# Patient Record
Sex: Male | Born: 1980 | Race: White | Hispanic: No | Marital: Married
Health system: Southern US, Community
[De-identification: ages and names within clinical notes are randomized; demographics above are authoritative.]

---

## 2015-10-12 ENCOUNTER — Emergency Department (HOSPITAL_BASED_OUTPATIENT_CLINIC_OR_DEPARTMENT_OTHER): Payer: Worker's Compensation

## 2015-10-12 ENCOUNTER — Emergency Department (HOSPITAL_BASED_OUTPATIENT_CLINIC_OR_DEPARTMENT_OTHER)
Admission: EM | Admit: 2015-10-12 | Discharge: 2015-10-13 | Disposition: A | Discharge status: Home / Self Care | Payer: Worker's Compensation | Attending: Emergency Medicine | Admitting: Emergency Medicine

## 2015-10-12 DIAGNOSIS — S161XXA Strain of muscle, fascia and tendon at neck level, initial encounter: Secondary | ICD-10-CM | POA: Insufficient documentation

## 2015-10-12 DIAGNOSIS — S060X0A Concussion without loss of consciousness, initial encounter: Secondary | ICD-10-CM | POA: Diagnosis not present

## 2015-10-12 DIAGNOSIS — Y9289 Other specified places as the place of occurrence of the external cause: Secondary | ICD-10-CM | POA: Diagnosis not present

## 2015-10-12 DIAGNOSIS — S0990XA Unspecified injury of head, initial encounter: Secondary | ICD-10-CM | POA: Diagnosis present

## 2015-10-12 DIAGNOSIS — Y9389 Activity, other specified: Secondary | ICD-10-CM | POA: Diagnosis not present

## 2015-10-12 DIAGNOSIS — Y990 Civilian activity done for income or pay: Secondary | ICD-10-CM | POA: Insufficient documentation

## 2015-10-12 NOTE — ED Provider Notes (Signed)
CSN: [id number]     Arrival date & time 10/12/15  2024 History  By signing my name below, I, Levon HedgerElizabeth Hall, attest that this documentation has been prepared under the direction and in the presence of Pricilla LovelessScott Awab Abebe, MD . Electronically Signed: Levon HedgerElizabeth Hall, Scribe. 10/12/2015. 10:44 PM.     Chief Complaint  Patient presents with  . Head Injury  . Neck Pain    The history is provided by the patient. No language interpreter was used.    HPI Comments:  Trevor Everett is a 35 y.o. male who presents to the Emergency Department s/p head injury that occurred earlier tonight. Pt states he was hit on very top of head with plane landing gear weighing ~100 lbs while working at [employer]. Pt was wearing a semi protective hat. Pt notes associated fatigue and worsening 7/10 neck and back pain s/p head injury. Pt has PMHx of ruptured disk and has baseline neck pain, but states this is worse. He has not taken any OTC medicine for pain. Pt denies any LOC, nausea, vomiting, blurry vision, dizziness weakness, tingling, or numbness.   No past medical history on file. No past surgical history on file. No family history on file. Social History  Substance Use Topics  . Smoking status: Not on file  . Smokeless tobacco: Not on file  . Alcohol Use: Not on file    Review of Systems  Constitutional: Positive for fatigue.  Eyes: Negative for visual disturbance.  Gastrointestinal: Negative for nausea and vomiting.  Musculoskeletal: Positive for back pain, arthralgias and neck pain.  Neurological: Negative for dizziness and syncope.  All other systems reviewed and are negative.   Allergies  Review of patient's allergies indicates not on file.  Home Medications   Prior to Admission medications   Medication Sig Start Date End Date Taking? Authorizing Provider  Cyclobenzaprine HCl (FLEXERIL PO) Take by mouth.   Yes Historical Provider, MD  MORPHINE SULFATE PO Take by mouth.   Yes Historical Provider, MD   Oxycodone-Acetaminophen (PERCOCET PO) Take by mouth.   Yes Historical Provider, MD   BP 141/87 mmHg  Pulse 66  Temp(Src) 98.2 F (36.8 C) (Oral)  Resp 20  Ht 5\' 6"  (1.676 m)  Wt 183 lb (83.008 kg)  BMI 29.55 kg/m2  SpO2 99% Physical Exam  Constitutional: He is oriented to person, place, and time. He appears well-developed and well-nourished.  HENT:  Head: Normocephalic and atraumatic.  Right Ear: External ear normal.  Left Ear: External ear normal.  Nose: Nose normal.  No scalp tenderness or obvious deformity or injury  Eyes: EOM are normal. Pupils are equal, round, and reactive to light. Right eye exhibits no discharge. Left eye exhibits no discharge.  Neck: Neck supple. Spinous process tenderness and muscular tenderness present.  Cardiovascular: Normal rate, regular rhythm, normal heart sounds and intact distal pulses.   Pulmonary/Chest: Effort normal and breath sounds normal.  Abdominal: Soft. There is no tenderness.  Musculoskeletal: He exhibits no edema.       Cervical back: He exhibits tenderness and bony tenderness.       Thoracic back: He exhibits tenderness and bony tenderness.       Lumbar back: He exhibits tenderness and bony tenderness.  Neurological: He is alert and oriented to person, place, and time.  Cn3-12 grossly intact 5/5 strength in all four extremities Grossly normal sensations Normal finger to nose  Skin: Skin is warm and dry.  Nursing note and vitals reviewed.   ED  Course  Procedures  DIAGNOSTIC STUDIES: Oxygen Saturation is 99% on RA, normal by my interpretation.    COORDINATION OF CARE:  10:39 PM Discussed treatment plan which includes head CT with pt at bedside and pt agreed to plan.  Labs Review Labs Reviewed - No data to display  Imaging Review Dg Thoracic Spine 4v  10/13/2015  CLINICAL DATA:  Crush injury to the top of the head tonight. Diffuse thoracic spine pain. EXAM: THORACIC SPINE - 4+ VIEW COMPARISON:  None. FINDINGS: There is  no evidence of thoracic spine fracture. Alignment is normal. No other significant bone abnormalities are identified. IMPRESSION: Negative. Electronically Signed   By: Burman Nieves M.D.   On: 10/13/2015 00:05   Dg Lumbar Spine Complete  10/13/2015  CLINICAL DATA:  Crush injury to the top of the head. Headache, neck pain, and back pain. EXAM: LUMBAR SPINE - COMPLETE 4+ VIEW COMPARISON:  None. FINDINGS: There is no evidence of lumbar spine fracture. Alignment is normal. Intervertebral disc spaces are maintained. IMPRESSION: Negative. Electronically Signed   By: Burman Nieves M.D.   On: 10/13/2015 00:04   Ct Head Wo Contrast  10/12/2015  CLINICAL DATA:  Landing gear of jet fell on patient's head, with headache and lower posterior neck pain. Initial encounter. EXAM: CT HEAD WITHOUT CONTRAST CT CERVICAL SPINE WITHOUT CONTRAST TECHNIQUE: Multidetector CT imaging of the head and cervical spine was performed following the standard protocol without intravenous contrast. Multiplanar CT image reconstructions of the cervical spine were also generated. COMPARISON:  None. FINDINGS: CT HEAD FINDINGS There is no evidence of acute infarction, mass lesion, or intra- or extra-axial hemorrhage on CT. The posterior fossa, including the cerebellum, brainstem and fourth ventricle, is within normal limits. The third and lateral ventricles, and basal ganglia are unremarkable in appearance. The cerebral hemispheres are symmetric in appearance, with normal gray-white differentiation. No mass effect or midline shift is seen. There is no evidence of fracture; visualized osseous structures are unremarkable in appearance. The visualized portions of the orbits are within normal limits. The paranasal sinuses and mastoid air cells are well-aerated. No significant soft tissue abnormalities are seen. CT CERVICAL SPINE FINDINGS There is no evidence of fracture or subluxation. Loss of the normal lordotic curvature of the cervical spine is  likely positional in nature. Vertebral bodies demonstrate normal height and alignment. Intervertebral disc spaces are preserved. Prevertebral soft tissues are within normal limits. The visualized neural foramina are grossly unremarkable. The thyroid gland is unremarkable in appearance. The visualized lung apices are clear. No significant soft tissue abnormalities are seen. IMPRESSION: 1. No evidence of traumatic intracranial injury or fracture. 2. No evidence of fracture or subluxation along the cervical spine. Electronically Signed   By: Roanna Raider M.D.   On: 10/12/2015 23:41   Ct Cervical Spine Wo Contrast  10/12/2015  CLINICAL DATA:  Landing gear of jet fell on patient's head, with headache and lower posterior neck pain. Initial encounter. EXAM: CT HEAD WITHOUT CONTRAST CT CERVICAL SPINE WITHOUT CONTRAST TECHNIQUE: Multidetector CT imaging of the head and cervical spine was performed following the standard protocol without intravenous contrast. Multiplanar CT image reconstructions of the cervical spine were also generated. COMPARISON:  None. FINDINGS: CT HEAD FINDINGS There is no evidence of acute infarction, mass lesion, or intra- or extra-axial hemorrhage on CT. The posterior fossa, including the cerebellum, brainstem and fourth ventricle, is within normal limits. The third and lateral ventricles, and basal ganglia are unremarkable in appearance. The cerebral hemispheres are symmetric  in appearance, with normal gray-white differentiation. No mass effect or midline shift is seen. There is no evidence of fracture; visualized osseous structures are unremarkable in appearance. The visualized portions of the orbits are within normal limits. The paranasal sinuses and mastoid air cells are well-aerated. No significant soft tissue abnormalities are seen. CT CERVICAL SPINE FINDINGS There is no evidence of fracture or subluxation. Loss of the normal lordotic curvature of the cervical spine is likely positional in  nature. Vertebral bodies demonstrate normal height and alignment. Intervertebral disc spaces are preserved. Prevertebral soft tissues are within normal limits. The visualized neural foramina are grossly unremarkable. The thyroid gland is unremarkable in appearance. The visualized lung apices are clear. No significant soft tissue abnormalities are seen. IMPRESSION: 1. No evidence of traumatic intracranial injury or fracture. 2. No evidence of fracture or subluxation along the cervical spine. Electronically Signed   By: Roanna Raider M.D.   On: 10/12/2015 23:41   I have personally reviewed and evaluated these images and lab results as part of my medical decision-making.   EKG Interpretation None      MDM   Final diagnoses:  Concussion, without loss of consciousness, initial encounter  Neck strain, initial encounter    No acute findings on CT/xrays as above. Full ROM and c-collar removed. Patient's pain appears mild. Neurologically intact. Doubt ligamentous injury. Treat with nsaids and discussed return precautions. HA likely a mild concussion.  I personally performed the services described in this documentation, which was scribed in my presence. The recorded information has been reviewed and is accurate.    Pricilla Loveless, MD 10/13/15 [phone]

## 2015-10-12 NOTE — ED Notes (Signed)
[facility] ([place] [phone]) called  PTA and reported bystander/ witness concern for concussion r/t sx of: sleepiness, HA, light-headedness.

## 2015-10-12 NOTE — ED Notes (Addendum)
He was hit in the top of his head with landing gear while working at [employer]. No LOC. Neck pain. c collar applied at triage. Neuro intact.

## 2015-10-12 NOTE — ED Notes (Signed)
Patient in CT

## 2016-12-07 IMAGING — CT CT CERVICAL SPINE W/O CM
4 of 7 series · 14 of 33 positions shown, 15 images · non-contrast
Comparison: None.

CLINICAL DATA: [employer] Dad[employer] of jet fell on patient's head, with
headache and lower posterior neck pain. Initial encounter.

EXAM:
CT HEAD WITHOUT CONTRAST
CT CERVICAL SPINE WITHOUT CONTRAST
TECHNIQUE: Multidetector CT imaging of the head and cervical spine was
performed following the standard protocol without intravenous
contrast. Multiplanar CT image reconstructions of the cervical spine
were also generated.

[Series 6: c_spine 2.0 i30s 3 · axial · 0.25mm/px · z∈[-305,-203]mm · 4 of 85 slices shown]
[im 17/85  bone]
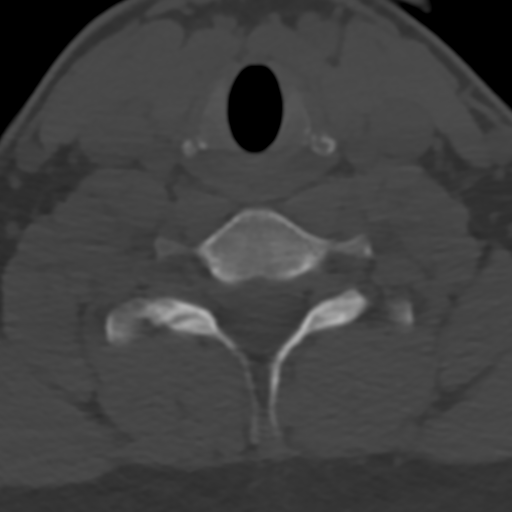
[im 34/85  bone]
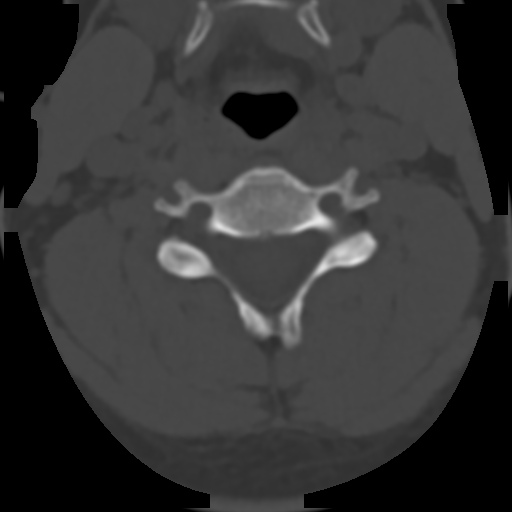
[im 51/85  bone]
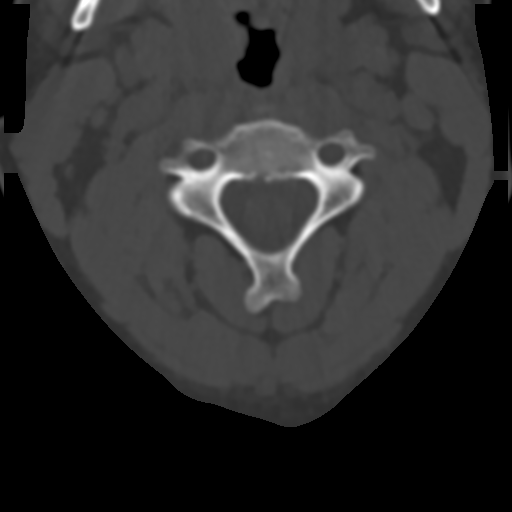
[im 68/85  bone]
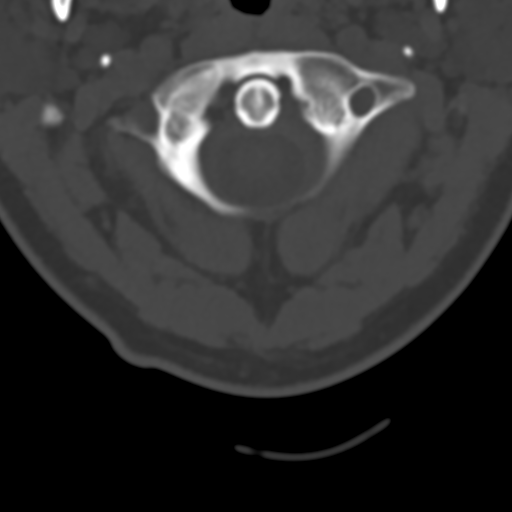

[Series 8: head 3.0 mpr cor · coronal · 0.30mm/px · 2 of 64 slices shown]
[im 22/64  bone]
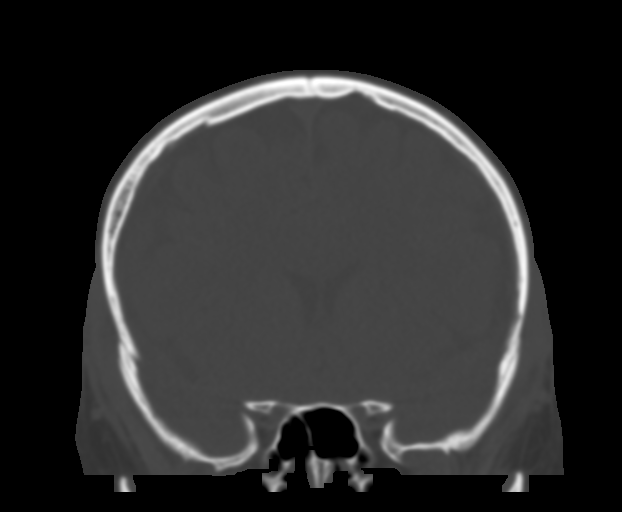
[im 43/64  bone]
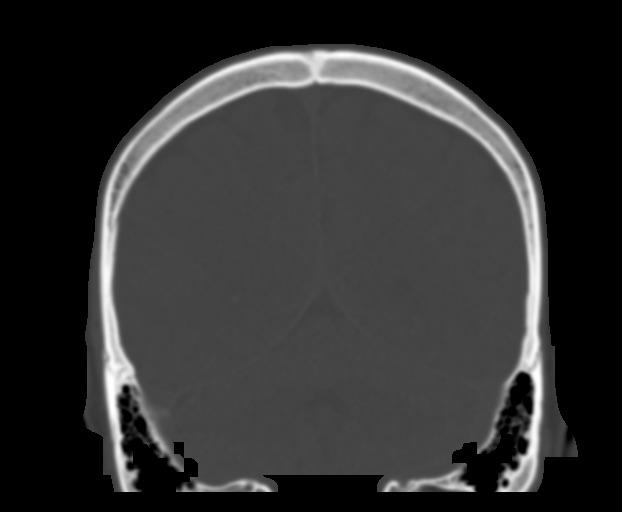

[Series 11: sagittals · sagittal · 0.23mm/px · 4 of 47 slices shown]
[im 10/47  bone]
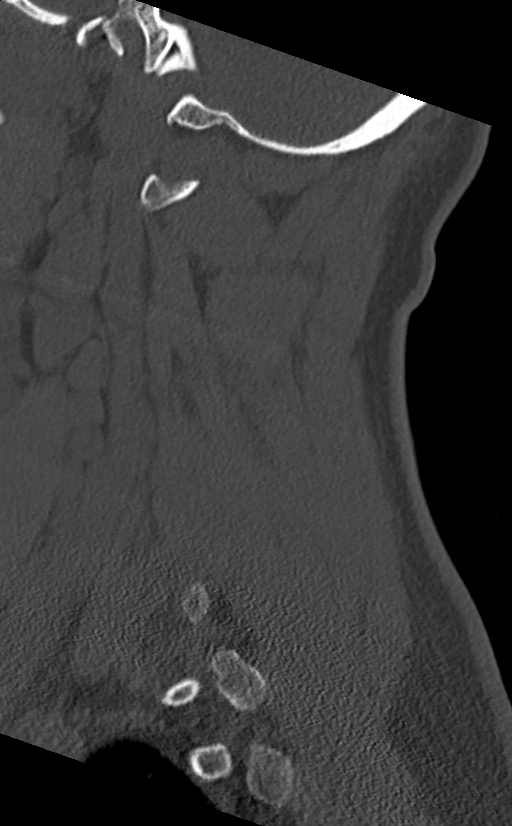
[im 19/47  bone]
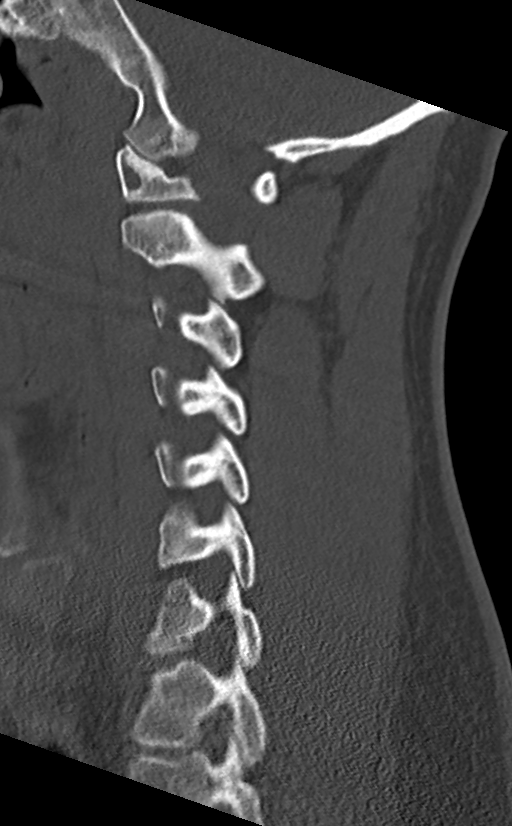
[im 28/47  bone]
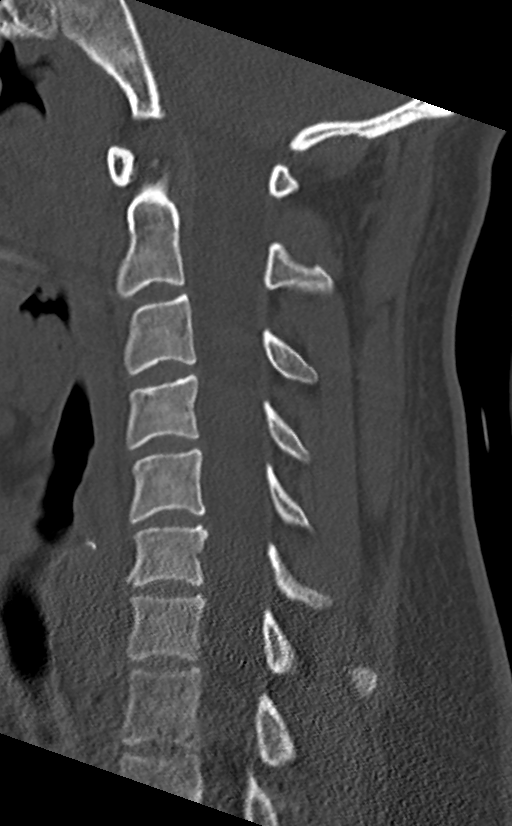
[im 37/47  bone]
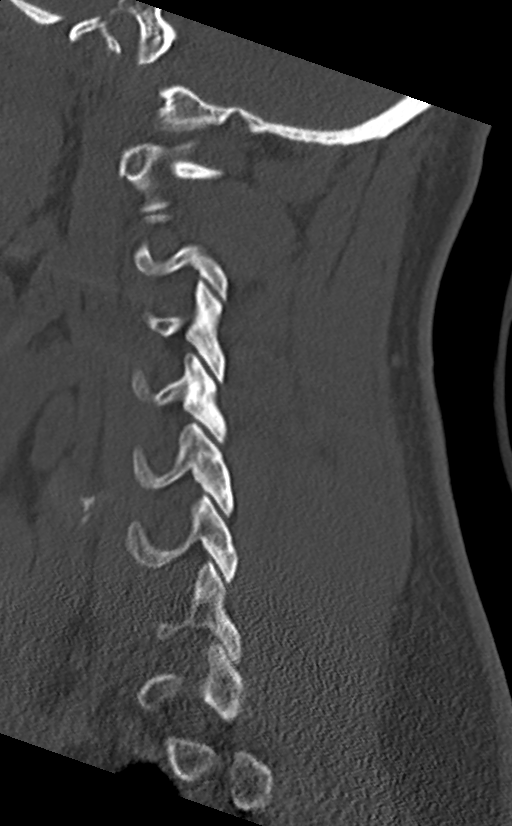

[Series 12: orthogonals · axial · 0.23mm/px · z∈[-330,-220]mm · 4 of 98 slices shown, 5 images]
[im 20/98  soft-tissue]
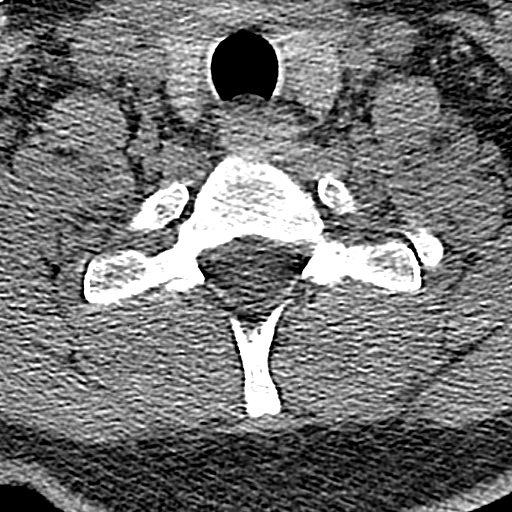
[im 20/98  bone]
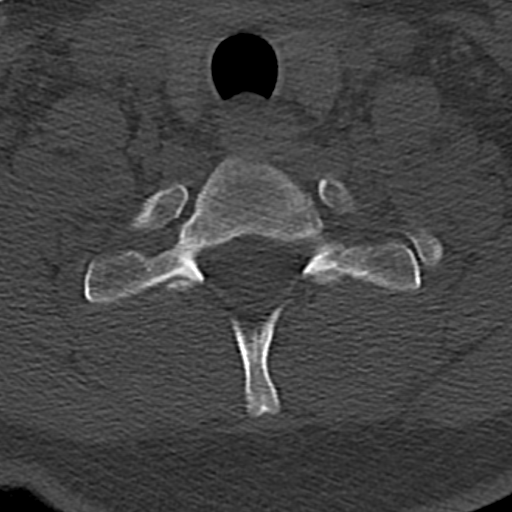
[im 39/98  bone]
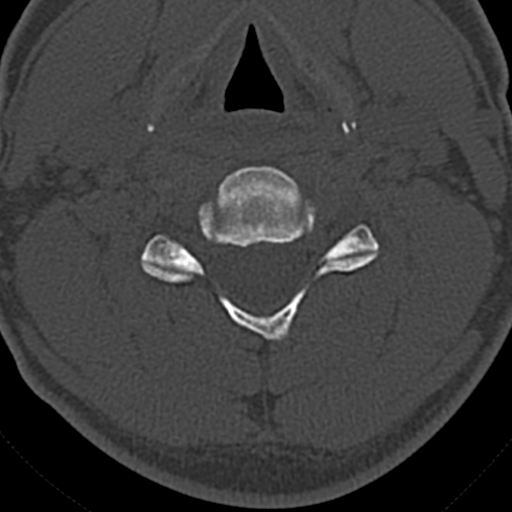
[im 59/98  bone]
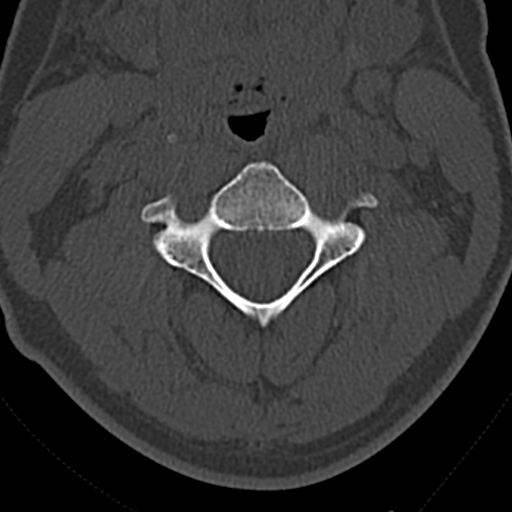
[im 78/98  bone]
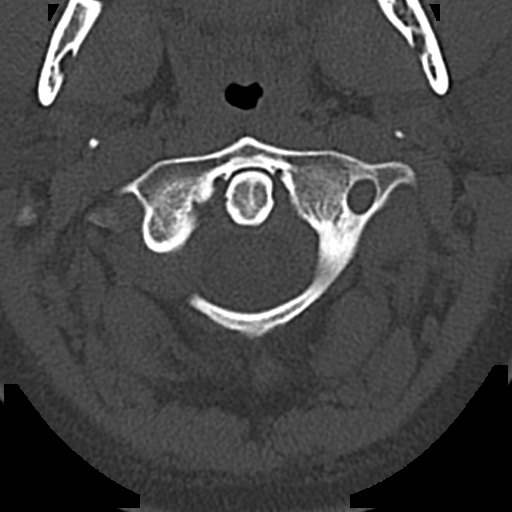

[14 of 33 positions shown; findings below may reference images not displayed]

FINDINGS: CT HEAD FINDINGS

There is no evidence of acute infarction, mass lesion, or intra- or
extra-axial hemorrhage on CT.

The posterior fossa, including the cerebellum, brainstem and fourth
ventricle, is within normal limits. The third and lateral
ventricles, and basal ganglia are unremarkable in appearance. The
cerebral hemispheres are symmetric in appearance, with normal
gray-white differentiation. No mass effect or midline shift is seen.

There is no evidence of fracture; visualized osseous structures are
unremarkable in appearance. The visualized portions of the orbits
are within normal limits. The paranasal sinuses and mastoid air
cells are well-aerated. No significant soft tissue abnormalities are
seen.

CT CERVICAL SPINE FINDINGS

There is no evidence of fracture or subluxation. Loss of the normal
lordotic curvature of the cervical spine is likely positional in
nature. Vertebral bodies demonstrate normal height and alignment.
Intervertebral disc spaces are preserved. Prevertebral soft tissues
are within normal limits. The visualized neural foramina are grossly
unremarkable.

The thyroid gland is unremarkable in appearance. The visualized lung
apices are clear. No significant soft tissue abnormalities are seen.
IMPRESSION: 1. No evidence of traumatic intracranial injury or fracture.
2. No evidence of fracture or subluxation along the cervical spine.
